# Patient Record
Sex: Female | Born: 1955 | Race: White | Hispanic: No | Marital: Married | Smoking: Never smoker
Health system: Southern US, Community
[De-identification: ages and names within clinical notes are randomized; demographics above are authoritative.]

---

## 2004-07-21 ENCOUNTER — Ambulatory Visit: Payer: Self-pay

## 2011-04-20 ENCOUNTER — Ambulatory Visit: Payer: Self-pay | Admitting: Internal Medicine

## 2012-07-16 ENCOUNTER — Ambulatory Visit: Payer: Self-pay | Admitting: Internal Medicine

## 2014-06-05 ENCOUNTER — Ambulatory Visit: Payer: Self-pay

## 2016-08-07 DIAGNOSIS — C50912 Malignant neoplasm of unspecified site of left female breast: Secondary | ICD-10-CM | POA: Insufficient documentation

## 2016-09-04 DIAGNOSIS — Z170 Estrogen receptor positive status [ER+]: Secondary | ICD-10-CM

## 2016-09-04 DIAGNOSIS — C50912 Malignant neoplasm of unspecified site of left female breast: Secondary | ICD-10-CM | POA: Insufficient documentation

## 2016-12-26 IMAGING — CR DG CHEST 2V
2 series · 2 of 2 positions shown · non-contrast
Comparison: None.

CLINICAL DATA: Recent diagnosis of granulomatous dermatitis.
Evaluate for possible sarcoidosis.

EXAM:
CHEST  2 VIEW

[chest pa]
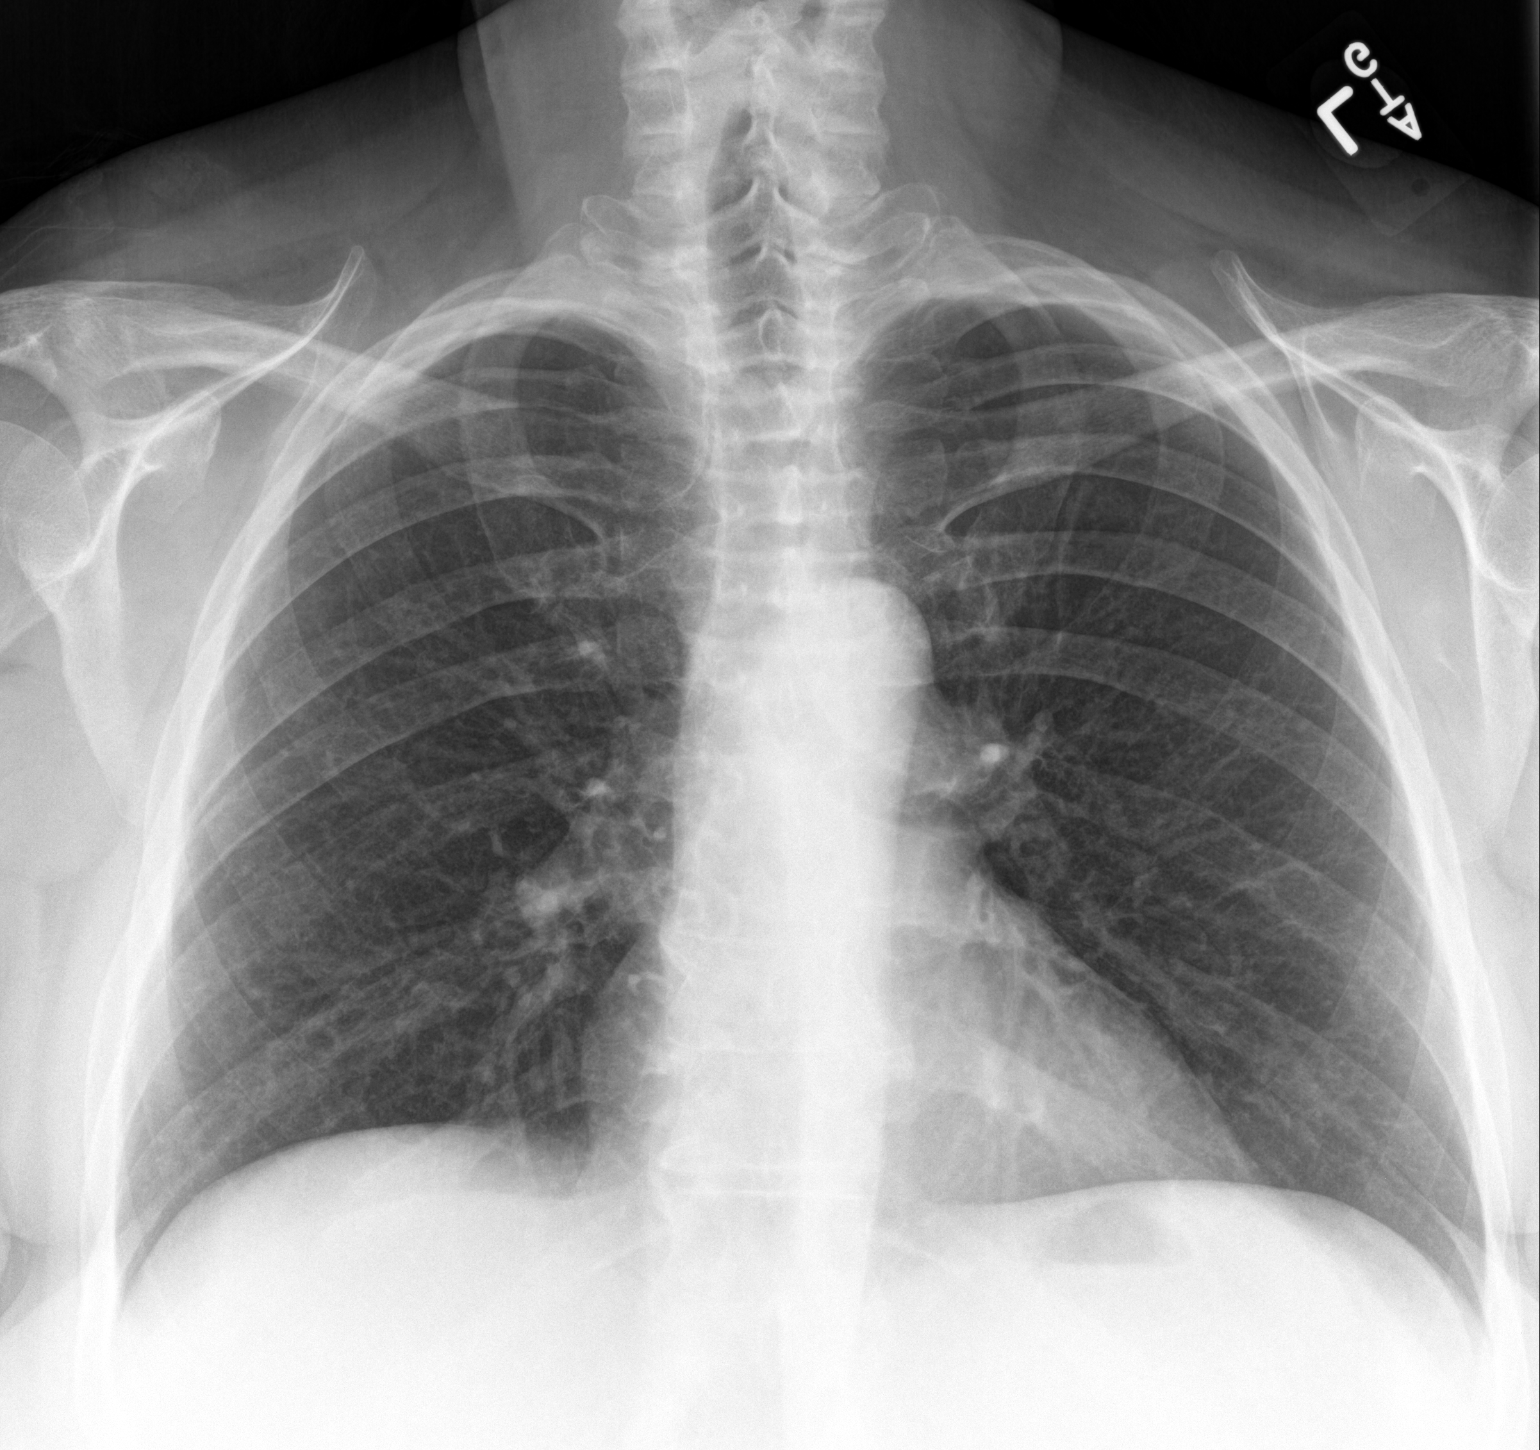

[chest lat]
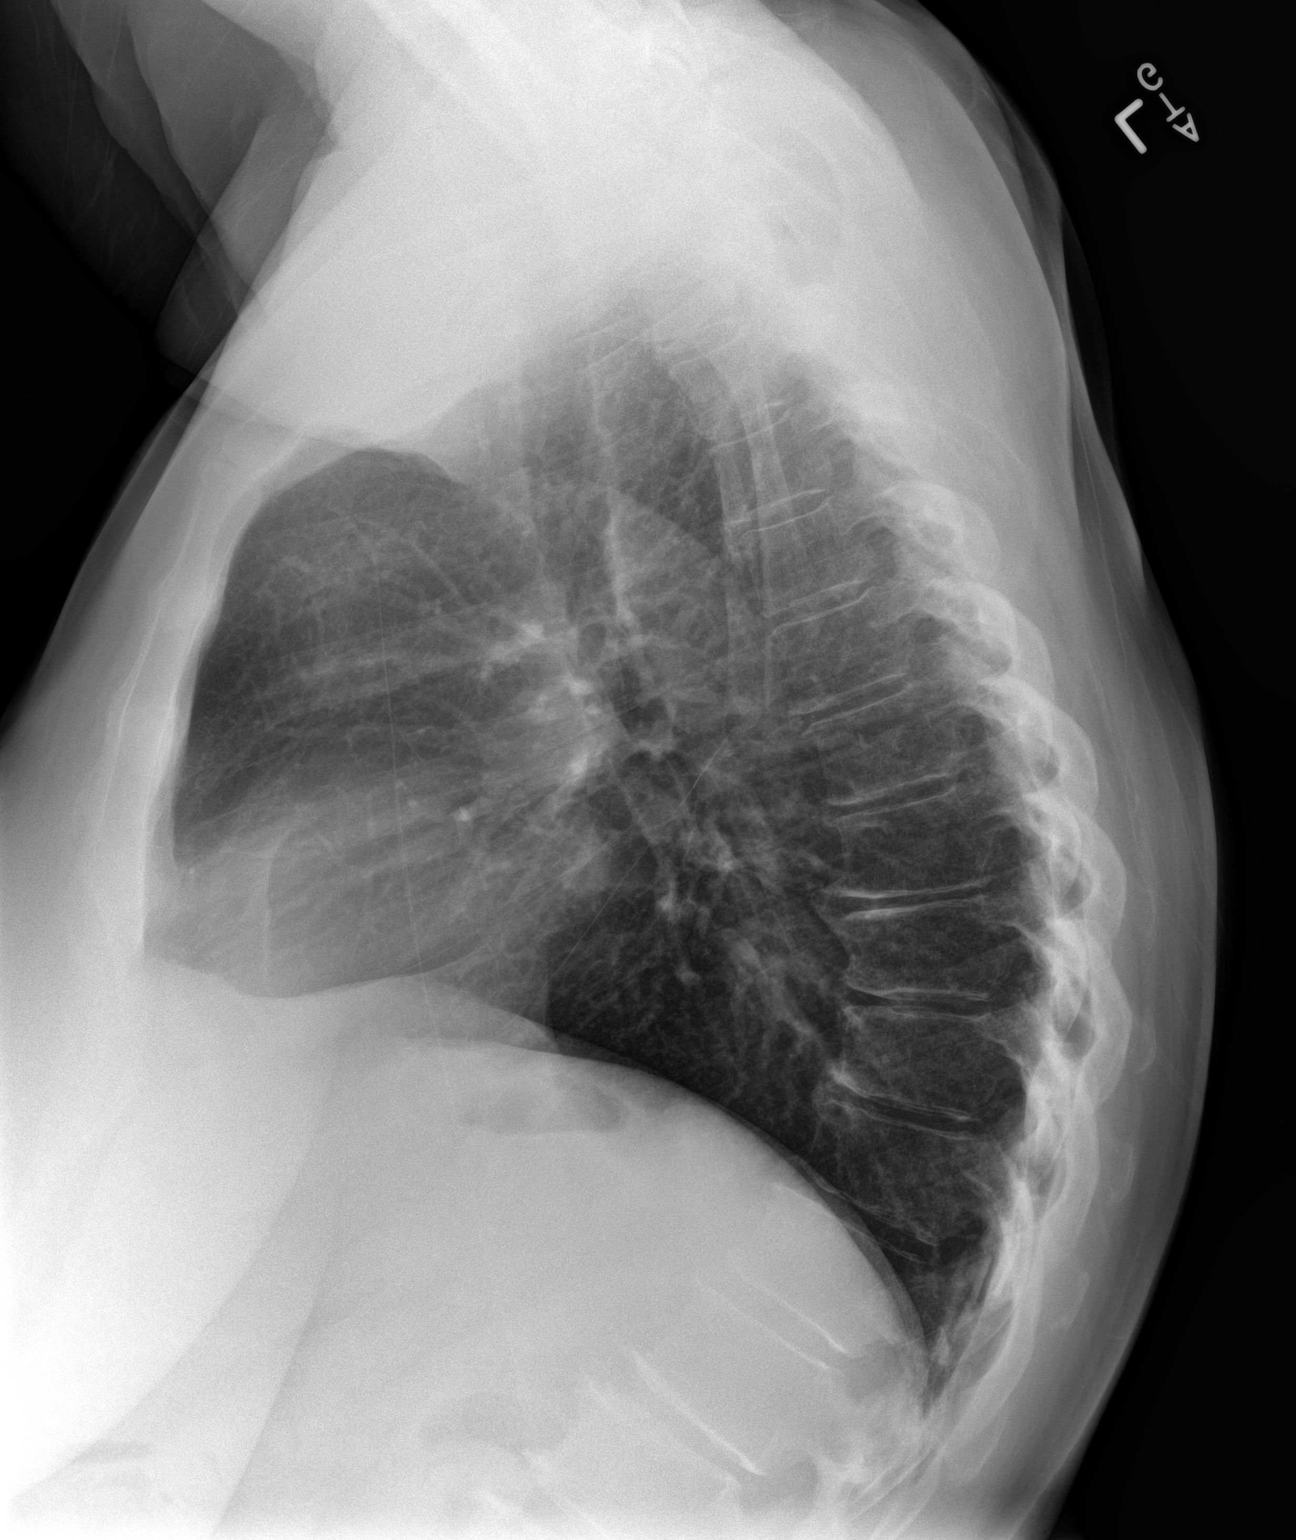

[2 of 2 positions shown; findings below may reference images not displayed]

FINDINGS: Cardiomediastinal silhouette unremarkable. Lungs clear.
Bronchovascular markings normal. Pulmonary vascularity normal. No
visible pleural effusions. No pneumothorax. Degenerative changes
involving the thoracic spine with exaggeration of the usual thoracic
kyphosis.
IMPRESSION: No acute cardiopulmonary disease.  No evidence of sarcoidosis.

## 2017-02-16 DIAGNOSIS — R7989 Other specified abnormal findings of blood chemistry: Secondary | ICD-10-CM | POA: Insufficient documentation

## 2017-03-16 DIAGNOSIS — E210 Primary hyperparathyroidism: Secondary | ICD-10-CM | POA: Insufficient documentation

## 2017-04-11 ENCOUNTER — Other Ambulatory Visit: Payer: Self-pay | Admitting: Internal Medicine

## 2017-04-11 DIAGNOSIS — R1013 Epigastric pain: Secondary | ICD-10-CM

## 2017-04-13 ENCOUNTER — Ambulatory Visit
Admission: RE | Admit: 2017-04-13 | Discharge: 2017-04-13 | Disposition: A | Discharge status: Home / Self Care | Payer: BC Managed Care – PPO | Source: Ambulatory Visit | Attending: Internal Medicine | Admitting: Internal Medicine

## 2017-04-13 DIAGNOSIS — R1013 Epigastric pain: Secondary | ICD-10-CM | POA: Insufficient documentation

## 2017-10-17 ENCOUNTER — Ambulatory Visit (INDEPENDENT_AMBULATORY_CARE_PROVIDER_SITE_OTHER): Payer: BC Managed Care – PPO | Admitting: Podiatry

## 2017-10-17 ENCOUNTER — Ambulatory Visit: Payer: BC Managed Care – PPO

## 2017-10-17 ENCOUNTER — Encounter: Payer: Self-pay | Admitting: Podiatry

## 2017-10-17 ENCOUNTER — Ambulatory Visit (INDEPENDENT_AMBULATORY_CARE_PROVIDER_SITE_OTHER): Payer: BC Managed Care – PPO

## 2017-10-17 DIAGNOSIS — M779 Enthesopathy, unspecified: Secondary | ICD-10-CM

## 2017-10-17 DIAGNOSIS — M2041 Other hammer toe(s) (acquired), right foot: Secondary | ICD-10-CM

## 2017-10-17 DIAGNOSIS — Q828 Other specified congenital malformations of skin: Secondary | ICD-10-CM

## 2017-10-17 DIAGNOSIS — M778 Other enthesopathies, not elsewhere classified: Secondary | ICD-10-CM

## 2017-10-17 NOTE — Progress Notes (Signed)
Subjective:  Patient ID: Christine Montgomery, female    DOB: 04-Jul-1955,  MRN: [id number] HPI Chief Complaint  Patient presents with  . Plantar Fasciitis    Patient presents today for painful plantar warts bottom of left foot  and left arch/heel pain x years, progressivly getting worse.  She reports the pain is sharp and burning and worse when first standing up from sitting.  She has been taking Ibuprofen and Gabapentin for relief    She also c/o left 5th toe pains comes and goes.  She reports having history of left 5th toe fracture  . Foot Pain    62 y.o. female presents with the above complaint.   ROS: Denies fever chills nausea vomiting muscle aches pains calf pain back pain chest pain shortness of breath headache.  No past medical history on file.   Current Outpatient Medications:  .  anastrozole (ARIMIDEX) 1 MG tablet, Take 1 mg by mouth., Disp: , Rfl:  .  furosemide (LASIX) 20 MG tablet, Take 20 mg by mouth., Disp: , Rfl:  .  hydrocortisone 2.5 % cream, , Disp: , Rfl:  .  Ivermectin 1 % CREA, Apply once a day, Disp: , Rfl:  .  Multiple Vitamin (MULTI-VITAMINS) TABS, , Disp: , Rfl:  .  naproxen sodium (ALEVE) 220 MG tablet, , Disp: , Rfl:  .  Sulfacetamide Sodium, Acne, 10 % LOTN, , Disp: , Rfl:  .  triamcinolone cream (KENALOG) 0.5 %, Apply topically., Disp: , Rfl:  .  acetaminophen (TYLENOL) 325 MG tablet, Take by mouth., Disp: , Rfl:  .  aspirin EC 81 MG tablet, Take by mouth., Disp: , Rfl:  .  Biotin 2500 MCG CAPS, Take by mouth., Disp: , Rfl:  .  Cetirizine HCl (ZYRTEC ALLERGY) 10 MG CAPS, Take by mouth., Disp: , Rfl:  .  diphenhydrAMINE (BENADRYL) 25 mg capsule, Take 25 mg by mouth., Disp: , Rfl:  .  famotidine (PEPCID) 10 MG tablet, Take 10 mg by mouth., Disp: , Rfl:  .  Flaxseed Oil (LINSEED OIL) OIL, Use., Disp: , Rfl:  .  gabapentin (NEURONTIN) 300 MG capsule, TAKE 1 CAPSULE UP TO THREE TIMES A DAY FOR SENSITIVITY IN HANDS AND FEET, Disp: , Rfl: 2 .   hydrochlorothiazide (HYDRODIURIL) 25 MG tablet, Take 25 mg by mouth., Disp: , Rfl:  .  ibuprofen (ADVIL,MOTRIN) 200 MG tablet, Take 200 mg by mouth., Disp: , Rfl:  .  irbesartan (AVAPRO) 300 MG tablet, Take 300 mg by mouth., Disp: , Rfl:  .  loratadine (CLARITIN) 10 MG tablet, Take 10 mg by mouth., Disp: , Rfl:  .  magnesium oxide (MAG-OX) 400 MG tablet, Take 400 mg by mouth., Disp: , Rfl:  .  meloxicam (MOBIC) 15 MG tablet, Take 15 mg by mouth., Disp: , Rfl:  .  Omega-3 Fatty Acids (FISH OIL PO), Take by mouth., Disp: , Rfl:  .  pantoprazole (PROTONIX) 40 MG tablet, , Disp: , Rfl:  .  ranitidine (ZANTAC) 75 MG tablet, Take by mouth., Disp: , Rfl:  .  senna (SENOKOT) 8.6 MG tablet, Take by mouth., Disp: , Rfl:   Allergies  Allergen Reactions  . Almond Oil Other (See Comments)    Blisters in mouth   . Doxycycline Dermatitis, Hives and Rash  . Sulfa Antibiotics Hives  . Terbinafine Hcl Swelling and Other (See Comments)  . Penicillins Rash  . Cephalexin Dermatitis   Review of Systems Objective:  There were no vitals filed for this visit.  General: Well developed, nourished, in no acute distress, alert and oriented x3   Dermatological: Skin is warm, dry and supple bilateral. Nails x 10 are well maintained; remaining integument appears unremarkable at this time. There are no open sores, no preulcerative lesions, no rash or signs of infection present.  Vascular: Dorsalis Pedis artery and Posterior Tibial artery pedal pulses are 2/4 bilateral with immedate capillary fill time. Pedal hair growth present. No varicosities and no lower extremity edema present bilateral.   Neruologic: Grossly intact via light touch bilateral. Vibratory intact via tuning fork bilateral. Protective threshold with Semmes Wienstein monofilament intact to all pedal sites bilateral. Patellar and Achilles deep tendon reflexes 2+ bilateral. No Babinski or clonus noted bilateral.   Musculoskeletal: No gross boney pedal  deformities bilateral. No pain, crepitus, or limitation noted with foot and ankle range of motion bilateral. Muscular strength 5/5 in all groups tested bilateral.  Gait: Unassisted, Nonantalgic.    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  Tolerated procedure well.  Debrided reactive hyperkeratosis plantar aspect of left foot.     Max T. [place], [place]

## 2017-11-21 ENCOUNTER — Encounter: Payer: Self-pay | Admitting: Podiatry

## 2017-11-21 ENCOUNTER — Ambulatory Visit (INDEPENDENT_AMBULATORY_CARE_PROVIDER_SITE_OTHER): Payer: BC Managed Care – PPO | Admitting: Podiatry

## 2017-11-21 DIAGNOSIS — M7752 Other enthesopathy of left foot: Secondary | ICD-10-CM | POA: Diagnosis not present

## 2017-11-21 DIAGNOSIS — M7751 Other enthesopathy of right foot: Secondary | ICD-10-CM

## 2017-11-21 DIAGNOSIS — M778 Other enthesopathies, not elsewhere classified: Secondary | ICD-10-CM

## 2017-11-21 DIAGNOSIS — L600 Ingrowing nail: Secondary | ICD-10-CM | POA: Diagnosis not present

## 2017-11-21 DIAGNOSIS — M779 Enthesopathy, unspecified: Secondary | ICD-10-CM

## 2017-11-21 MED ORDER — NEOMYCIN-POLYMYXIN-HC 1 % OT SOLN
OTIC | 1 refills | Status: AC
Start: 2017-11-21 — End: ?

## 2017-11-21 NOTE — Progress Notes (Signed)
She presents today chief concern painful ingrown toenail fibular border of the left hallux.  States that the neuritis seems to be doing better.  Still having pain along the medial aspect of the left foot.  Objective: Vital signs are stable she is alert and oriented x3 still has tenderness on palpation along the medial aspect of the foot and on palpation to the third interdigital space left.  Sharp incurvated nail margin along the fibular border of the hallux left squiggly tender on palpation no purulence no malodor.  Assessment: Resolving neuroma third interdigital space left chronic pain along the medial aspect and plantar medial aspect of the left foot.  Sharp incurvated toenail.  Cannot rule out plantar fasciitis as well.  Plan: Discussed etiology pathology conservative or surgical therapies at this point, go ahead and performed a chemical matrixectomy after 3 cc of a 50-50 mixture of Marcaine plain lidocaine plain was was infiltrated to the hallux left.  Tolerated procedure well without complications.  At this point we also had her scanned for orthotics.  She was provided with both oral and written home-going instruction for the care and soaking of her toe as well as a prescription for Cortisporin Otic to be applied twice daily after soaking.  I will follow-up with her once her orthotics come in and we will check the toe at the same time.  She will notify us with questions or concerns.

## 2017-11-21 NOTE — Patient Instructions (Signed)

## 2017-12-12 ENCOUNTER — Encounter: Payer: Self-pay | Admitting: Podiatry

## 2017-12-12 ENCOUNTER — Other Ambulatory Visit: Payer: BC Managed Care – PPO | Admitting: Orthotics

## 2017-12-12 ENCOUNTER — Ambulatory Visit (INDEPENDENT_AMBULATORY_CARE_PROVIDER_SITE_OTHER): Payer: BC Managed Care – PPO | Admitting: Podiatry

## 2017-12-12 DIAGNOSIS — L600 Ingrowing nail: Secondary | ICD-10-CM

## 2017-12-12 NOTE — Progress Notes (Signed)
Presents today for follow-up of ingrown toenail hallux left.  She was going to pick up her orthotics today however they are not in yet.  Objective: Vital signs are stable alert and oriented x3.  Pulses are palpable.  Neurologic sensorium is intact.  Degenerative flexors are intact.  Muscle strength is normal and symmetrical bilateral.  The fibular border of the hallux left appears to be healing very nicely there is no erythema edema cellulitis drainage or odor.  Assessment: Well-healing matrixectomy.  Myofascial pain medial longitudinal foot from the forefoot to the heel left.  Plan: Discussed etiology pathology and surgical therapies at this point at this point we can continue to soak every other day Epsom salts and warm water Band-Aid during the day and leave open at bedtime.  We will notify her once the orthotics have come in.

## 2018-01-02 ENCOUNTER — Ambulatory Visit: Payer: BC Managed Care – PPO | Admitting: Orthotics

## 2018-01-02 DIAGNOSIS — M779 Enthesopathy, unspecified: Principal | ICD-10-CM

## 2018-01-02 DIAGNOSIS — M778 Other enthesopathies, not elsewhere classified: Secondary | ICD-10-CM

## 2018-01-02 NOTE — Progress Notes (Signed)
Patient came in today to pick up custom made foot orthotics.  The goals were accomplished and the patient reported no dissatisfaction with said orthotics.  Patient was advised of breakin period and how to report any issues. 

## 2018-02-20 ENCOUNTER — Ambulatory Visit: Payer: BC Managed Care – PPO | Admitting: Orthotics

## 2018-02-20 DIAGNOSIS — M778 Other enthesopathies, not elsewhere classified: Secondary | ICD-10-CM

## 2018-02-20 DIAGNOSIS — M779 Enthesopathy, unspecified: Principal | ICD-10-CM

## 2018-02-20 DIAGNOSIS — M2041 Other hammer toe(s) (acquired), right foot: Secondary | ICD-10-CM

## 2018-02-20 DIAGNOSIS — L600 Ingrowing nail: Secondary | ICD-10-CM

## 2018-02-20 NOTE — Progress Notes (Signed)
Send back to [facility] to adjust: 1) 3/4 with vinyl cover and ppt lining 2) lower arch 1/8"  3) valgus 3* wedge.to keep from rolling out.  All this to attempt to address dorsum discomfort and knee pain.

## 2018-03-05 ENCOUNTER — Ambulatory Visit
Admission: RE | Admit: 2018-03-05 | Discharge: 2018-03-05 | Disposition: A | Discharge status: Home / Self Care | Payer: BC Managed Care – PPO | Source: Ambulatory Visit | Attending: Internal Medicine | Admitting: Internal Medicine

## 2018-03-05 ENCOUNTER — Other Ambulatory Visit: Payer: Self-pay | Admitting: Internal Medicine

## 2018-03-05 DIAGNOSIS — R059 Cough, unspecified: Secondary | ICD-10-CM

## 2018-03-05 DIAGNOSIS — R05 Cough: Secondary | ICD-10-CM

## 2018-03-05 DIAGNOSIS — R0600 Dyspnea, unspecified: Secondary | ICD-10-CM | POA: Diagnosis present

## 2018-03-07 IMAGING — US US ABDOMEN COMPLETE
1 series · 14 of 25 positions shown · non-contrast
Comparison: None.

CLINICAL DATA: Epigastric dominant pain for 3 months. Patient is
being treated with chemotherapy for breast cancer.

EXAM:
ABDOMEN ULTRASOUND COMPLETE

[Series 1: us abdomen complete · 0.22mm/px · 14 of 114 slices shown]
[im 1/114]
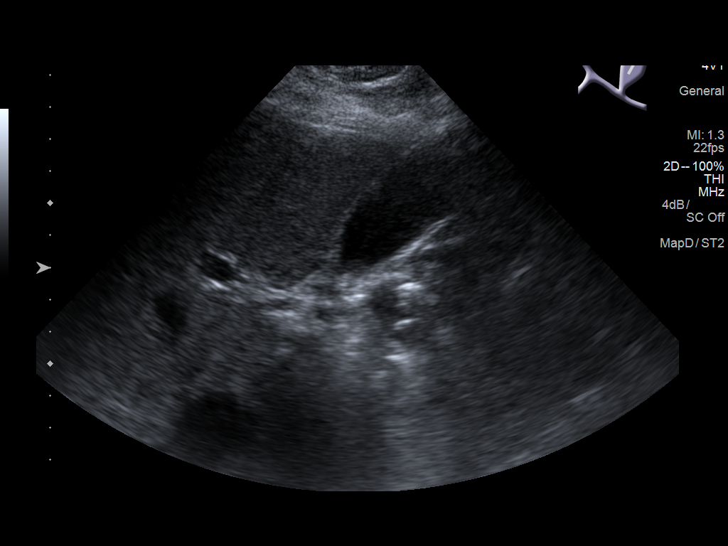
[im 10/114]
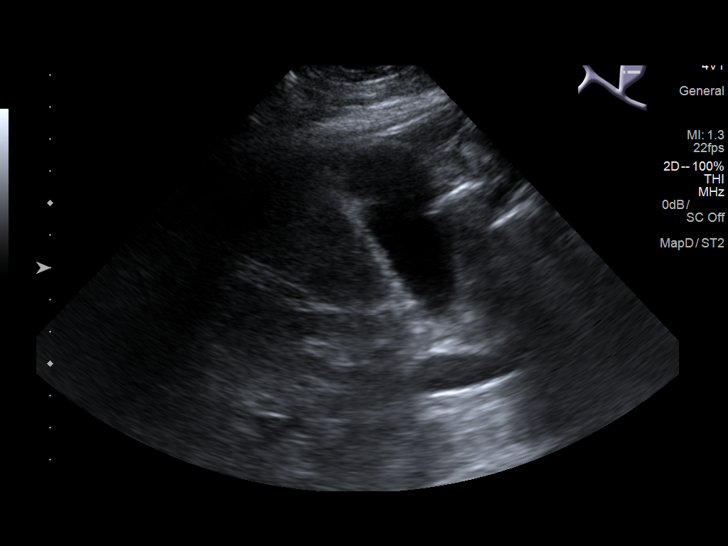
[im 19/114]
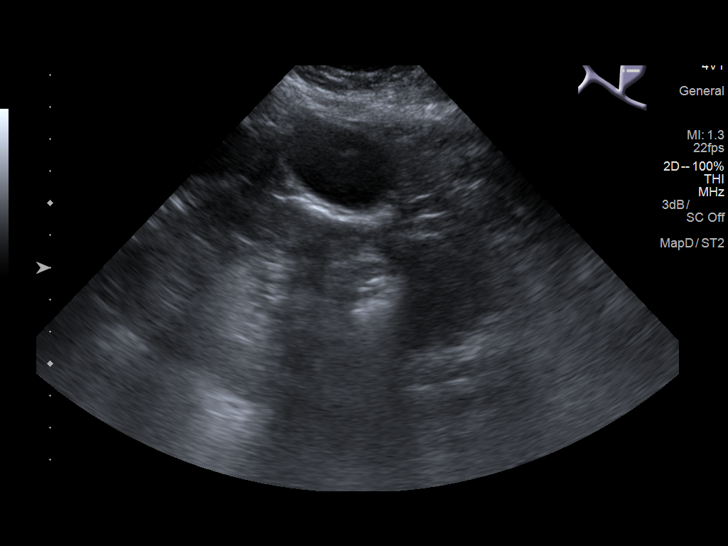
[im 29/114]
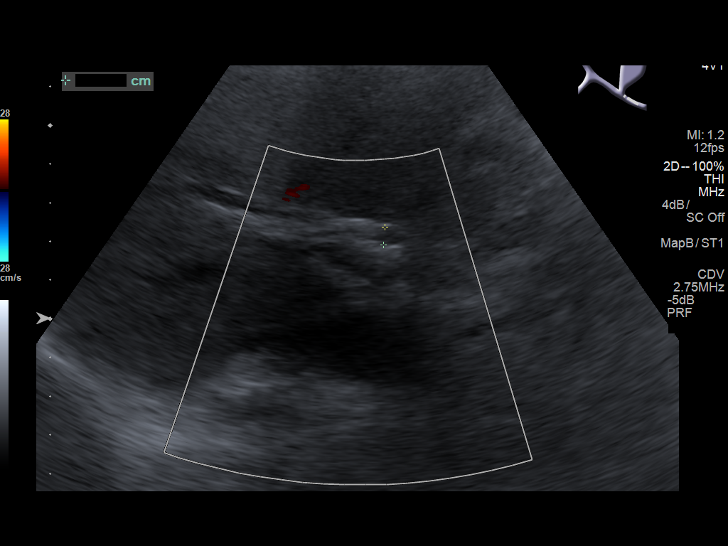
[im 38/114]
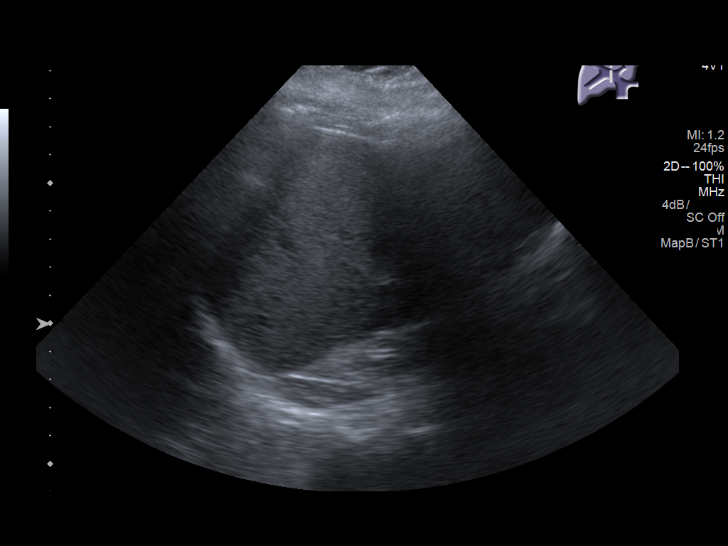
[im 43/114]
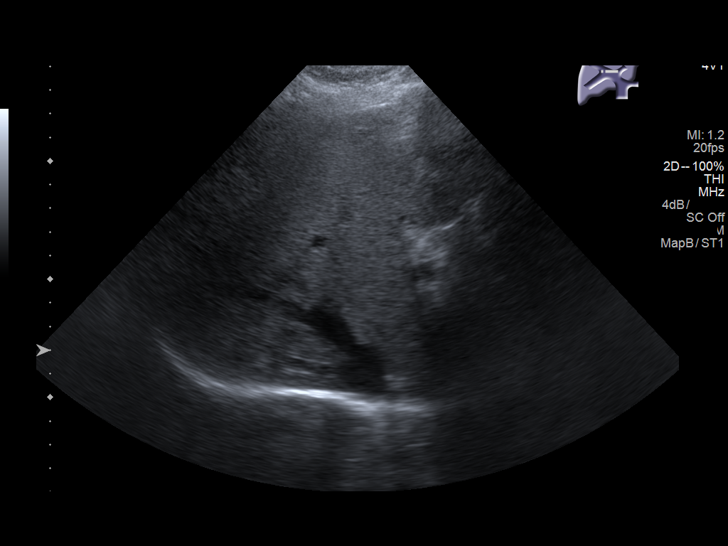
[im 52/114]
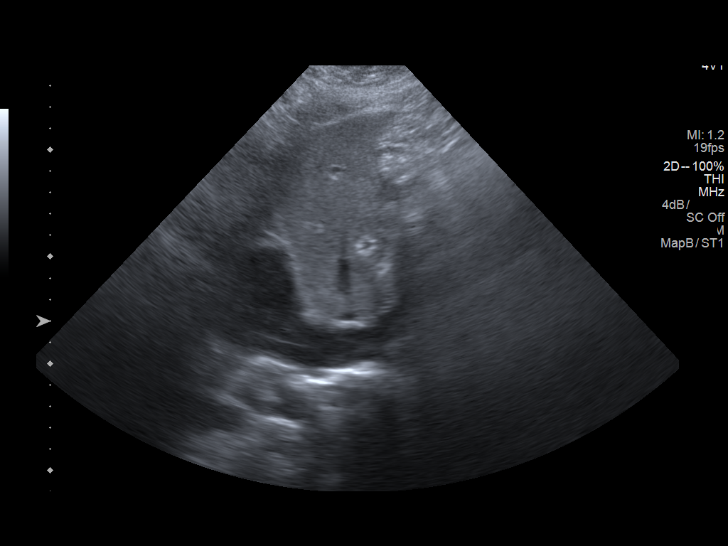
[im 62/114]
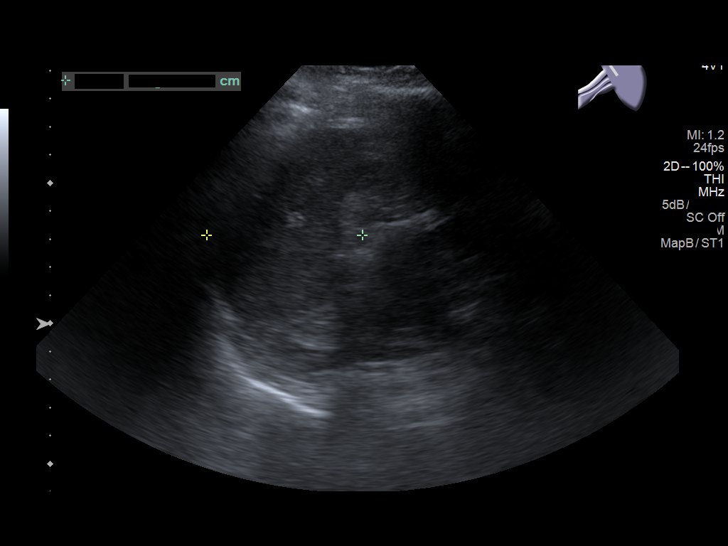
[im 71/114]
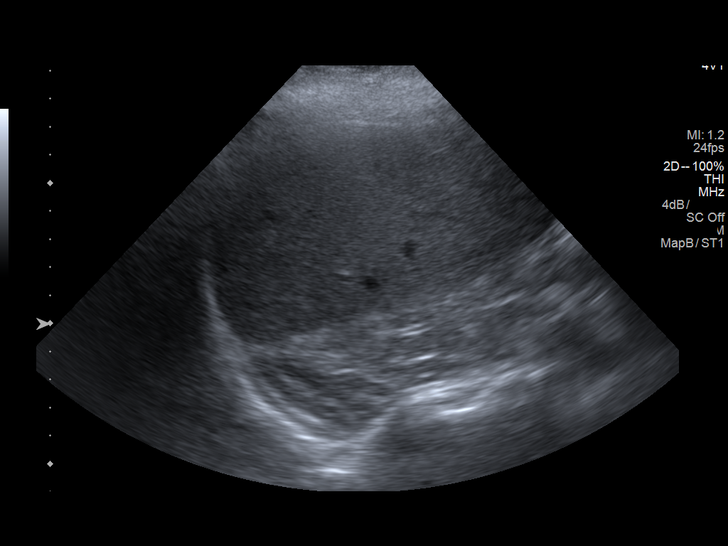
[im 76/114]
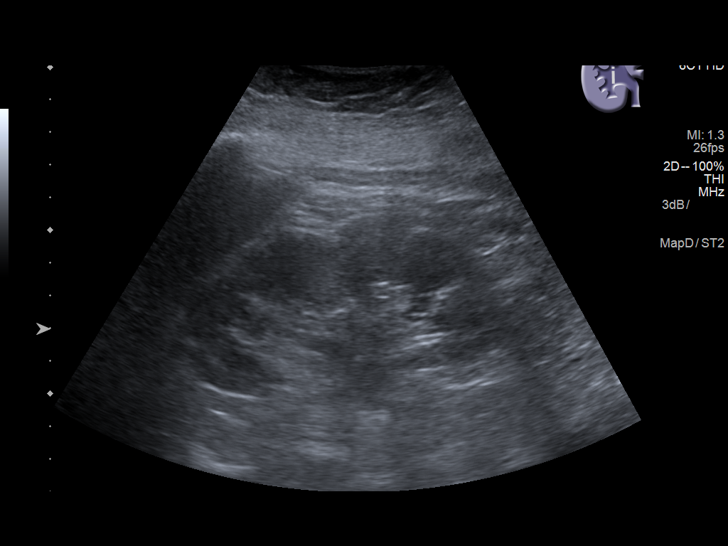
[im 85/114]
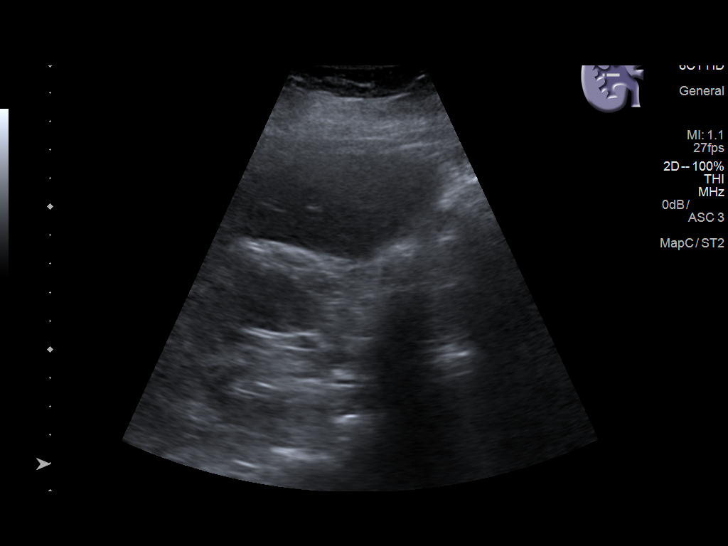
[im 95/114]
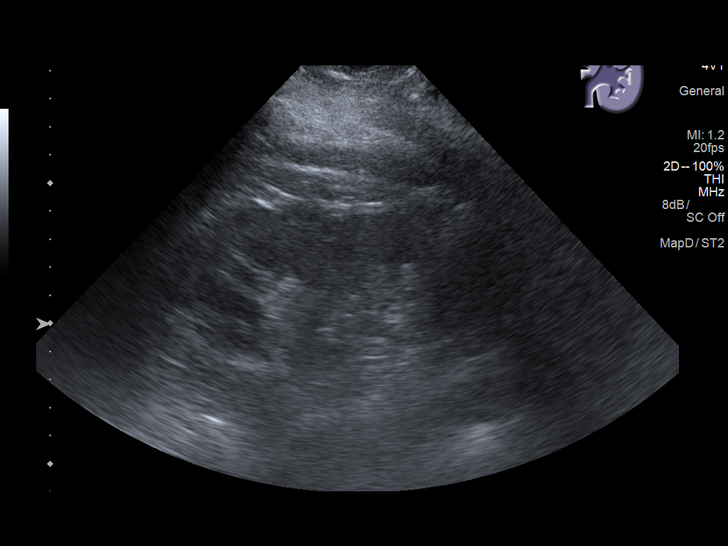
[im 104/114]
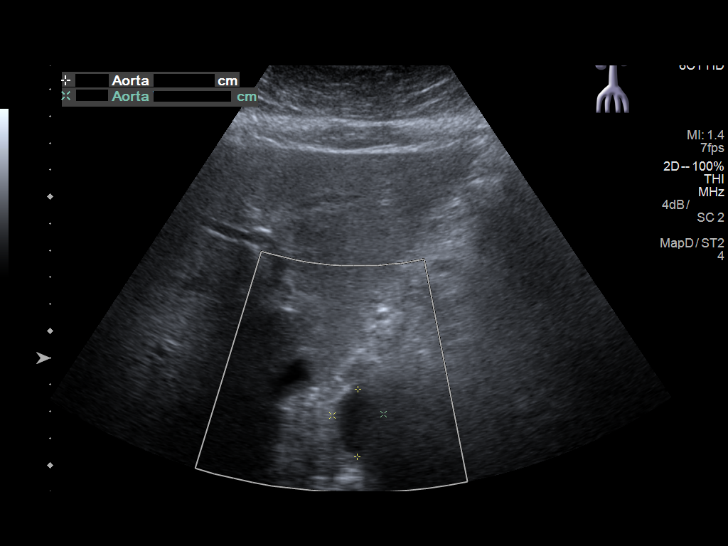
[im 114/114]
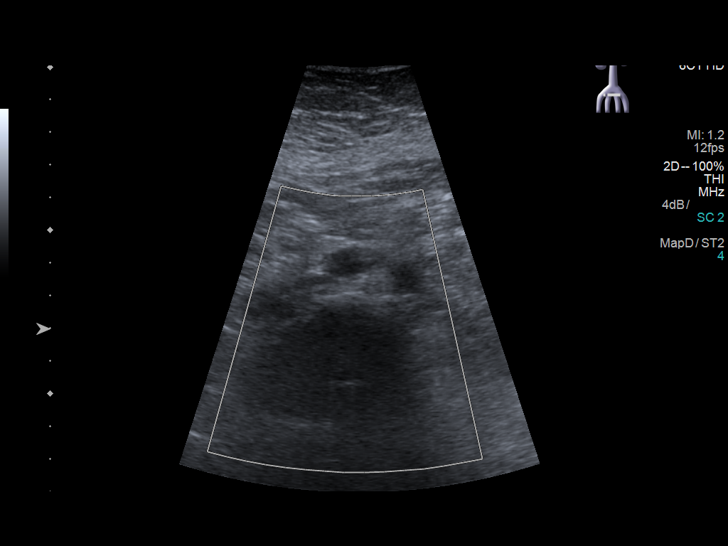

[14 of 25 positions shown; findings below may reference images not displayed]

FINDINGS: Gallbladder: No gallstones or wall thickening visualized. No
sonographic Murphy sign noted by sonographer.

Common bile duct: Diameter: 4.6 mm.

Liver: No focal lesion identified. Within normal limits in
parenchymal echogenicity. Portal vein is patent on color Doppler
imaging with normal direction of blood flow towards the liver.

IVC: No abnormality visualized.

Pancreas: Visualized portion unremarkable.

Spleen: Size and appearance within normal limits.

Right Kidney: Length: 9.9 cm. Echogenicity within normal limits. No
mass or hydronephrosis visualized.

Left Kidney: Length: 11.2 cm. Echogenicity within normal limits. No
mass or hydronephrosis visualized.

Abdominal aorta: No aneurysm visualized.

Other findings: None.
IMPRESSION: No evidence of acute sonographic abnormalities within the abdomen.

## 2018-03-27 ENCOUNTER — Ambulatory Visit: Payer: BC Managed Care – PPO | Admitting: Orthotics

## 2018-03-27 DIAGNOSIS — M2041 Other hammer toe(s) (acquired), right foot: Secondary | ICD-10-CM

## 2018-03-27 DIAGNOSIS — Q828 Other specified congenital malformations of skin: Secondary | ICD-10-CM

## 2018-03-27 DIAGNOSIS — M779 Enthesopathy, unspecified: Principal | ICD-10-CM

## 2018-03-27 DIAGNOSIS — M778 Other enthesopathies, not elsewhere classified: Secondary | ICD-10-CM

## 2018-03-27 NOTE — Progress Notes (Signed)
Patient came in today to pick up custom made foot orthotics.  The goals were accomplished and the patient reported no dissatisfaction with said orthotics.  Patient was advised of breakin period and how to report any issues. 

## 2018-06-03 ENCOUNTER — Other Ambulatory Visit: Payer: Self-pay

## 2018-06-03 ENCOUNTER — Encounter: Payer: Self-pay | Admitting: Podiatry

## 2018-06-03 ENCOUNTER — Ambulatory Visit: Payer: BC Managed Care – PPO | Admitting: Podiatry

## 2018-06-03 DIAGNOSIS — Q828 Other specified congenital malformations of skin: Secondary | ICD-10-CM | POA: Diagnosis not present

## 2018-06-03 NOTE — Progress Notes (Signed)
She presents today for follow-up of capsulitis stating that she had orthotics but the main the tops of her feet hurt.  Objective: Vital signs are stable alert and oriented x3.  For keratotic lesion plantar aspect of the forefoot left.  Assessment porokeratosis.  Plan: Debrided reactive hyperkeratotic lesion no iatrogenic lesions.

## 2019-04-11 ENCOUNTER — Ambulatory Visit: Payer: Medicare PPO | Attending: Internal Medicine

## 2019-04-11 DIAGNOSIS — Z23 Encounter for immunization: Secondary | ICD-10-CM | POA: Insufficient documentation

## 2019-04-11 NOTE — Progress Notes (Signed)
   Covid-19 Vaccination Clinic  Name:  Christine Montgomery    MRN: [id number] DOB: 07-16-1955  04/11/2019  Ms. Remedies was observed post Covid-19 immunization for 15 minutes without incidence. She was provided with Vaccine Information Sheet and instruction to access the V-Safe system.   [name] was instructed to call 911 with any severe reactions post vaccine: Marland Kitchen Difficulty breathing  . Swelling of your face and throat  . A fast heartbeat  . A bad rash all over your body  . Dizziness and weakness    Immunizations Administered    Name Date Dose VIS Date Route   Pfizer COVID-19 Vaccine 04/11/2019  6:31 PM 0.3 mL 02/28/2019 Intramuscular   Manufacturer: [place]   Lot: [id number]   [place]: [id number]

## 2019-05-02 ENCOUNTER — Ambulatory Visit: Payer: Medicare PPO | Attending: Internal Medicine

## 2019-05-02 ENCOUNTER — Other Ambulatory Visit: Payer: Self-pay

## 2019-05-02 DIAGNOSIS — Z23 Encounter for immunization: Secondary | ICD-10-CM

## 2019-05-02 NOTE — Progress Notes (Signed)
   Covid-19 Vaccination Clinic  Name:  Nakenya Meller    MRN: [id number] DOB: 1955/12/02  05/02/2019  Ms. Wieringa was observed post Covid-19 immunization for 15 minutes without incidence. She was provided with Vaccine Information Sheet and instruction to access the V-Safe system.   Ms. Reap was instructed to call 911 with any severe reactions post vaccine: Marland Kitchen Difficulty breathing  . Swelling of your face and throat  . A fast heartbeat  . A bad rash all over your body  . Dizziness and weakness    Immunizations Administered    Name Date Dose VIS Date Route   Pfizer COVID-19 Vaccine 05/02/2019  4:45 PM 0.3 mL 02/28/2019 Intramuscular   Manufacturer: [place]   Lot: [id number]   [place]: [id number]

## 2020-09-25 IMAGING — DX DG CHEST 2V
2 series · 2 of 2 positions shown · non-contrast
Comparison: 06/05/2014

CLINICAL DATA: Cough and shortness of breath

EXAM:
CHEST - 2 VIEW

[chest pa]
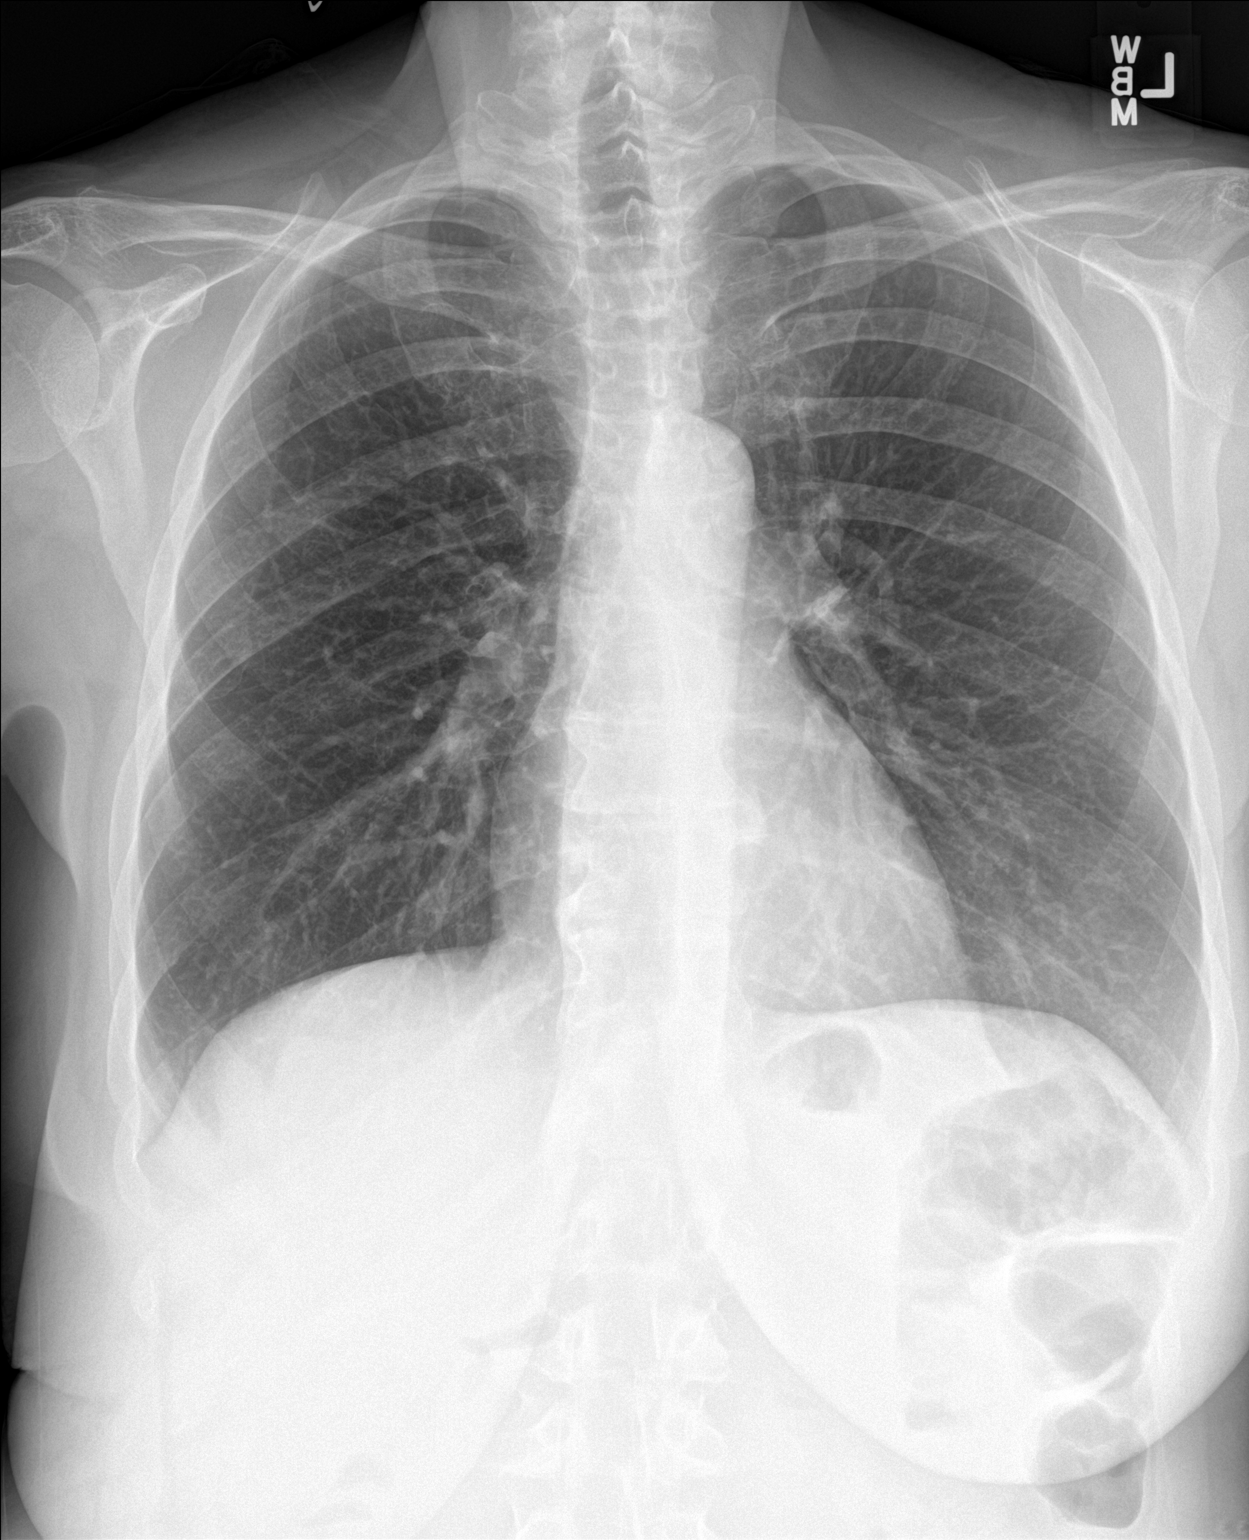

[chest lat]
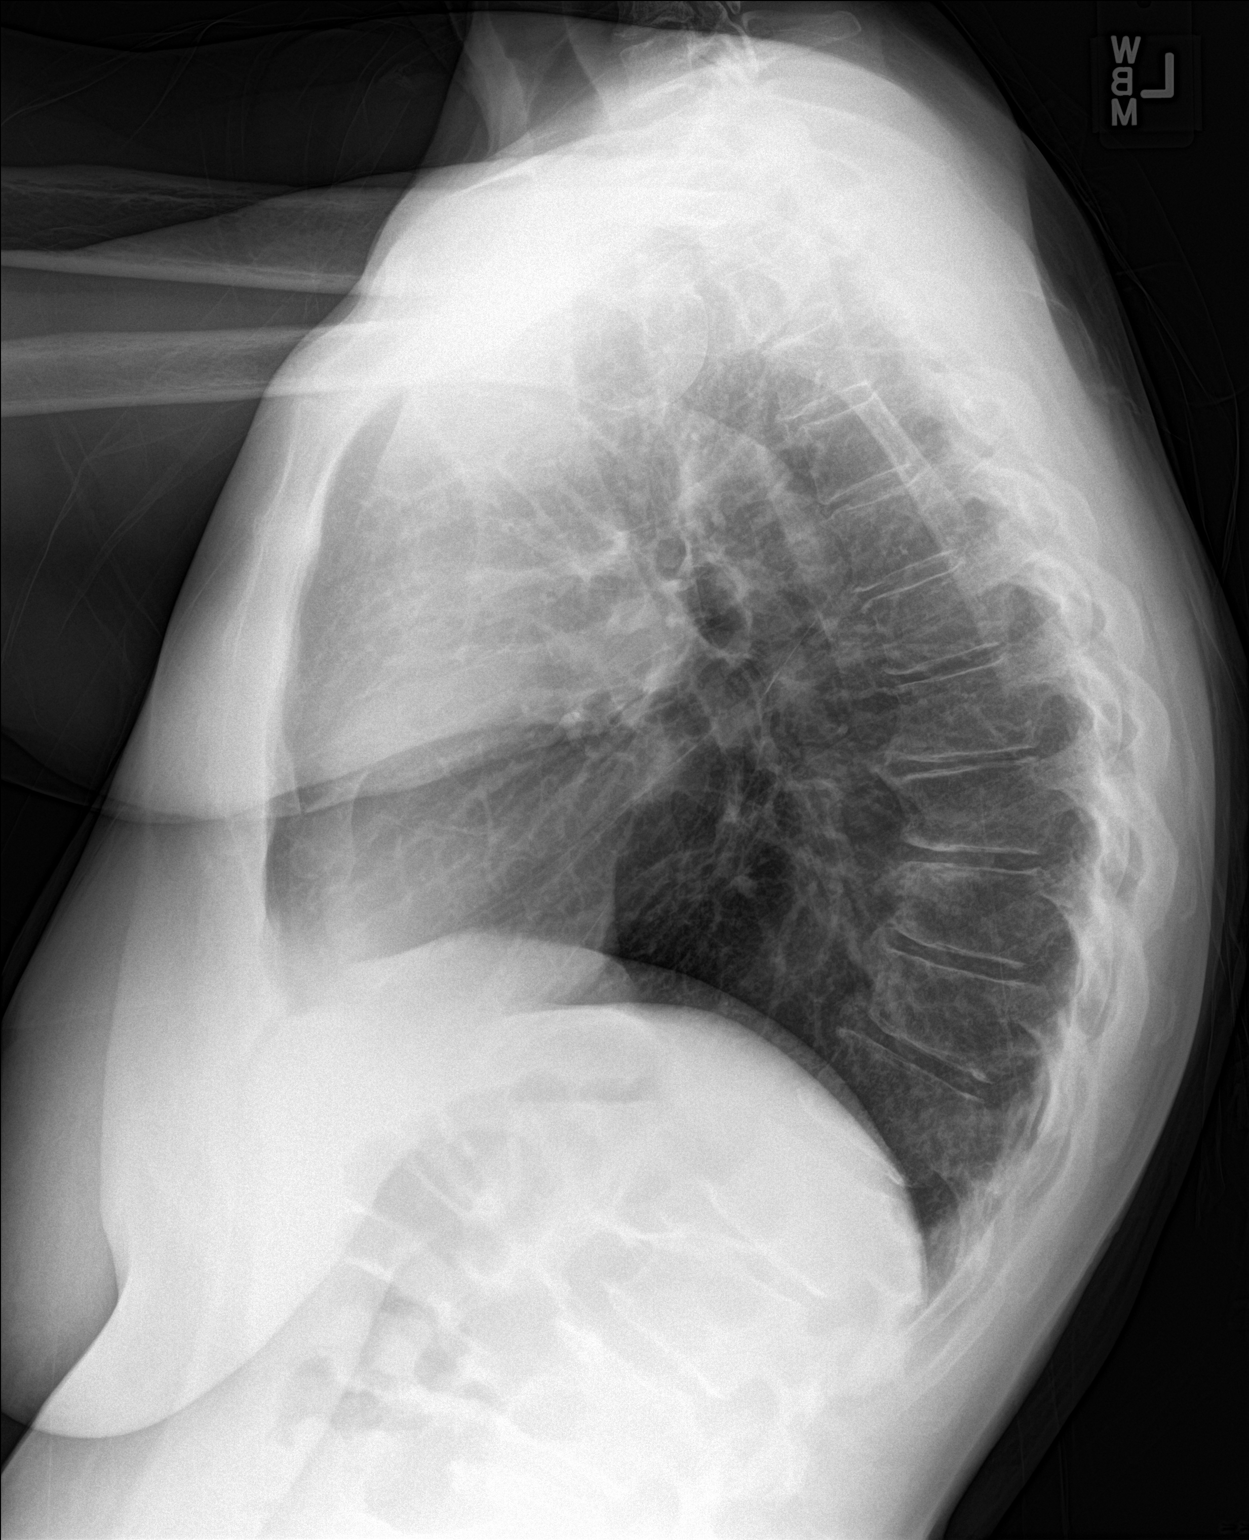

[2 of 2 positions shown; findings below may reference images not displayed]

FINDINGS: Cardiac shadows within normal limits. The lungs are well aerated
bilaterally. No focal infiltrate or sizable effusion is seen. Mild
degenerative changes of the thoracic spine are again noted.
IMPRESSION: No acute abnormality noted.

## 2023-10-15 ENCOUNTER — Other Ambulatory Visit: Payer: Self-pay | Admitting: Medical Genetics

## 2023-10-17 ENCOUNTER — Other Ambulatory Visit
Admission: RE | Admit: 2023-10-17 | Discharge: 2023-10-17 | Disposition: A | Discharge status: Home / Self Care | Payer: Self-pay | Source: Ambulatory Visit | Attending: Medical Genetics | Admitting: Medical Genetics

## 2023-10-28 LAB — GENECONNECT MOLECULAR SCREEN: Genetic Analysis Overall Interpretation: NEGATIVE
